# Patient Record
Sex: Male | Born: 1975 | Race: Black or African American | Hispanic: No | Marital: Single | Smoking: Never smoker
Health system: Southern US, Community
[De-identification: ages and names within clinical notes are randomized; demographics above are authoritative.]

## PROBLEM LIST (undated history)

## (undated) DIAGNOSIS — Z650 Conviction in civil and criminal proceedings without imprisonment: Secondary | ICD-10-CM

## (undated) DIAGNOSIS — F29 Unspecified psychosis not due to a substance or known physiological condition: Secondary | ICD-10-CM

---

## 2009-06-21 ENCOUNTER — Emergency Department (HOSPITAL_COMMUNITY): Admission: EM | Admit: 2009-06-21 | Discharge: 2009-06-21 | Payer: Self-pay | Admitting: Emergency Medicine

## 2015-04-18 ENCOUNTER — Inpatient Hospital Stay (HOSPITAL_COMMUNITY)
Admission: EM | Admit: 2015-04-18 | Discharge: 2015-04-19 | DRG: 885 | Disposition: A | Discharge status: Home / Self Care | Payer: Self-pay | Attending: Internal Medicine | Admitting: Internal Medicine

## 2015-04-18 ENCOUNTER — Encounter (HOSPITAL_COMMUNITY): Payer: Self-pay | Admitting: Internal Medicine

## 2015-04-18 ENCOUNTER — Inpatient Hospital Stay (HOSPITAL_COMMUNITY): Payer: MEDICAID

## 2015-04-18 DIAGNOSIS — Z79899 Other long term (current) drug therapy: Secondary | ICD-10-CM

## 2015-04-18 DIAGNOSIS — N289 Disorder of kidney and ureter, unspecified: Secondary | ICD-10-CM

## 2015-04-18 DIAGNOSIS — I129 Hypertensive chronic kidney disease with stage 1 through stage 4 chronic kidney disease, or unspecified chronic kidney disease: Secondary | ICD-10-CM | POA: Diagnosis present

## 2015-04-18 DIAGNOSIS — F602 Antisocial personality disorder: Secondary | ICD-10-CM | POA: Diagnosis present

## 2015-04-18 DIAGNOSIS — K7689 Other specified diseases of liver: Secondary | ICD-10-CM

## 2015-04-18 DIAGNOSIS — E860 Dehydration: Secondary | ICD-10-CM | POA: Diagnosis present

## 2015-04-18 DIAGNOSIS — Z72811 Adult antisocial behavior: Secondary | ICD-10-CM | POA: Diagnosis present

## 2015-04-18 DIAGNOSIS — R945 Abnormal results of liver function studies: Secondary | ICD-10-CM | POA: Diagnosis present

## 2015-04-18 DIAGNOSIS — N184 Chronic kidney disease, stage 4 (severe): Secondary | ICD-10-CM | POA: Diagnosis present

## 2015-04-18 DIAGNOSIS — F1290 Cannabis use, unspecified, uncomplicated: Secondary | ICD-10-CM | POA: Diagnosis present

## 2015-04-18 DIAGNOSIS — R9431 Abnormal electrocardiogram [ECG] [EKG]: Secondary | ICD-10-CM | POA: Diagnosis present

## 2015-04-18 DIAGNOSIS — F23 Brief psychotic disorder: Secondary | ICD-10-CM | POA: Insufficient documentation

## 2015-04-18 DIAGNOSIS — E876 Hypokalemia: Secondary | ICD-10-CM | POA: Diagnosis present

## 2015-04-18 DIAGNOSIS — N179 Acute kidney failure, unspecified: Secondary | ICD-10-CM | POA: Diagnosis present

## 2015-04-18 DIAGNOSIS — F29 Unspecified psychosis not due to a substance or known physiological condition: Principal | ICD-10-CM | POA: Diagnosis present

## 2015-04-18 HISTORY — DX: Conviction in civil and criminal proceedings without imprisonment: Z65.0

## 2015-04-18 HISTORY — DX: Unspecified psychosis not due to a substance or known physiological condition: F29

## 2015-04-18 LAB — CBC WITH DIFFERENTIAL/PLATELET
BASOS PCT: 1 % (ref 0–1)
Basophils Absolute: 0 10*3/uL (ref 0.0–0.1)
Eosinophils Absolute: 0 10*3/uL (ref 0.0–0.7)
Eosinophils Relative: 0 % (ref 0–5)
HEMATOCRIT: 38 % — AB (ref 39.0–52.0)
HEMOGLOBIN: 13.3 g/dL (ref 13.0–17.0)
Lymphocytes Relative: 28 % (ref 12–46)
Lymphs Abs: 2.4 10*3/uL (ref 0.7–4.0)
MCH: 28.7 pg (ref 26.0–34.0)
MCHC: 35 g/dL (ref 30.0–36.0)
MCV: 81.9 fL (ref 78.0–100.0)
MONOS PCT: 11 % (ref 3–12)
Monocytes Absolute: 1 10*3/uL (ref 0.1–1.0)
NEUTROS PCT: 60 % (ref 43–77)
Neutro Abs: 5.2 10*3/uL (ref 1.7–7.7)
Platelets: 313 10*3/uL (ref 150–400)
RBC: 4.64 MIL/uL (ref 4.22–5.81)
RDW: 12.6 % (ref 11.5–15.5)
WBC: 8.6 10*3/uL (ref 4.0–10.5)

## 2015-04-18 LAB — COMPREHENSIVE METABOLIC PANEL
ALK PHOS: 49 U/L (ref 38–126)
ALT: 73 U/L — ABNORMAL HIGH (ref 17–63)
AST: 174 U/L — ABNORMAL HIGH (ref 15–41)
Albumin: 4.2 g/dL (ref 3.5–5.0)
Anion gap: 15 (ref 5–15)
BUN: 18 mg/dL (ref 6–20)
CALCIUM: 9.1 mg/dL (ref 8.9–10.3)
CO2: 23 mmol/L (ref 22–32)
Chloride: 98 mmol/L — ABNORMAL LOW (ref 101–111)
Creatinine, Ser: 1.68 mg/dL — ABNORMAL HIGH (ref 0.61–1.24)
GFR calc Af Amer: 59 mL/min — ABNORMAL LOW (ref >60)
GFR calc non Af Amer: 51 mL/min — ABNORMAL LOW (ref >60)
GLUCOSE: 108 mg/dL — AB (ref 65–99)
POTASSIUM: 2.7 mmol/L — AB (ref 3.5–5.1)
Sodium: 136 mmol/L (ref 135–145)
TOTAL PROTEIN: 7.5 g/dL (ref 6.5–8.1)
Total Bilirubin: 0.9 mg/dL (ref 0.3–1.2)

## 2015-04-18 LAB — SALICYLATE LEVEL: Salicylate Lvl: <4 mg/dL (ref 2.8–30.0)

## 2015-04-18 LAB — RAPID URINE DRUG SCREEN, HOSP PERFORMED
Amphetamines: NOT DETECTED
BENZODIAZEPINES: NOT DETECTED
Barbiturates: NOT DETECTED
Cocaine: NOT DETECTED
OPIATES: NOT DETECTED
Tetrahydrocannabinol: POSITIVE — AB

## 2015-04-18 LAB — I-STAT TROPONIN, ED: Troponin i, poc: 0.03 ng/mL (ref 0.00–0.08)

## 2015-04-18 LAB — ACETAMINOPHEN LEVEL: Acetaminophen (Tylenol), Serum: <10 ug/mL — ABNORMAL LOW (ref 10–30)

## 2015-04-18 LAB — MAGNESIUM: Magnesium: 2.4 mg/dL (ref 1.7–2.4)

## 2015-04-18 LAB — ETHANOL: Alcohol, Ethyl (B): <5 mg/dL (ref <5)

## 2015-04-18 LAB — TROPONIN I: TROPONIN I: 0.03 ng/mL (ref <0.031)

## 2015-04-18 LAB — TSH: TSH: 0.885 u[IU]/mL (ref 0.350–4.500)

## 2015-04-18 MED ORDER — LORAZEPAM 2 MG/ML IJ SOLN
2.0000 mg | Freq: Once | INTRAMUSCULAR | Status: AC
  Administered 2015-04-18: 2 mg via INTRAVENOUS
  Filled 2015-04-18: qty 1

## 2015-04-18 MED ORDER — ASPIRIN EC 325 MG PO TBEC
325.0000 mg | DELAYED_RELEASE_TABLET | Freq: Every day | ORAL | Status: DC
  Administered 2015-04-19: 325 mg via ORAL
  Filled 2015-04-18 (×2): qty 1

## 2015-04-18 MED ORDER — NITROGLYCERIN 0.4 MG SL SUBL: 0.4000 mg | SUBLINGUAL_TABLET | SUBLINGUAL | Status: DC | PRN

## 2015-04-18 MED ORDER — LORAZEPAM 2 MG/ML IJ SOLN
1.0000 mg | INTRAMUSCULAR | Status: DC | PRN
  Administered 2015-04-19: 1 mg via INTRAVENOUS
  Filled 2015-04-18: qty 1

## 2015-04-18 MED ORDER — POTASSIUM CHLORIDE 10 MEQ/100ML IV SOLN
10.0000 meq | Freq: Once | INTRAVENOUS | Status: AC
  Administered 2015-04-18: 10 meq via INTRAVENOUS
  Filled 2015-04-18: qty 100

## 2015-04-18 MED ORDER — SODIUM CHLORIDE 0.9 % IJ SOLN
3.0000 mL | Freq: Two times a day (BID) | INTRAMUSCULAR | Status: DC
  Administered 2015-04-18: 3 mL via INTRAVENOUS

## 2015-04-18 MED ORDER — SODIUM CHLORIDE 0.9 % IV BOLUS (SEPSIS): 1000.0000 mL | Freq: Once | INTRAVENOUS | Status: DC

## 2015-04-18 MED ORDER — SODIUM CHLORIDE 0.9 % IV SOLN
INTRAVENOUS | Status: DC
  Administered 2015-04-18 – 2015-04-19 (×2): via INTRAVENOUS

## 2015-04-18 MED ORDER — ALUM & MAG HYDROXIDE-SIMETH 200-200-20 MG/5ML PO SUSP: 30.0000 mL | Freq: Four times a day (QID) | ORAL | Status: DC | PRN

## 2015-04-18 MED ORDER — POTASSIUM CHLORIDE 20 MEQ/15ML (10%) PO SOLN: 40.0000 meq | Freq: Once | ORAL | Status: DC

## 2015-04-18 MED ORDER — ATORVASTATIN CALCIUM 40 MG PO TABS
40.0000 mg | ORAL_TABLET | Freq: Every day | ORAL | Status: DC
  Administered 2015-04-19: 40 mg via ORAL
  Filled 2015-04-18 (×2): qty 1

## 2015-04-18 MED ORDER — GUAIFENESIN-DM 100-10 MG/5ML PO SYRP: 5.0000 mL | ORAL_SOLUTION | ORAL | Status: DC | PRN

## 2015-04-18 MED ORDER — ACETAMINOPHEN 325 MG PO TABS: 650.0000 mg | ORAL_TABLET | Freq: Four times a day (QID) | ORAL | Status: DC | PRN

## 2015-04-18 MED ORDER — STERILE WATER FOR INJECTION IJ SOLN
INTRAMUSCULAR | Status: AC
  Administered 2015-04-18: 16:00:00
  Filled 2015-04-18: qty 10

## 2015-04-18 MED ORDER — HEPARIN SODIUM (PORCINE) 5000 UNIT/ML IJ SOLN
5000.0000 [IU] | Freq: Three times a day (TID) | INTRAMUSCULAR | Status: DC
  Administered 2015-04-19 (×2): 5000 [IU] via SUBCUTANEOUS
  Filled 2015-04-18 (×4): qty 1

## 2015-04-18 MED ORDER — ZIPRASIDONE MESYLATE 20 MG IM SOLR
20.0000 mg | Freq: Once | INTRAMUSCULAR | Status: AC
  Administered 2015-04-18: 20 mg via INTRAMUSCULAR
  Filled 2015-04-18: qty 20

## 2015-04-18 MED ORDER — POTASSIUM CHLORIDE 10 MEQ/100ML IV SOLN
10.0000 meq | INTRAVENOUS | Status: AC
  Administered 2015-04-18: 10 meq via INTRAVENOUS
  Filled 2015-04-18: qty 100

## 2015-04-18 MED ORDER — ACETAMINOPHEN 650 MG RE SUPP: 650.0000 mg | Freq: Four times a day (QID) | RECTAL | Status: DC | PRN

## 2015-04-18 NOTE — H&P (Signed)
[facility] History and Physical  Walter Cain DOB: 10-Dec-1975 DOA: 04/18/2015  Referring physician: ED physician PCP: No primary care provider on file.  Specialists:   Chief Complaint: acute phychosis  HPI: Walter Cain is a 39 y.o. male with PMH of psychosis, criminal record (3 murders per policeman), currently under IVC, who presents with acute phychosis.   Patient is sedated with Geodon initially, and is still partially sedated when I saw patient. It is very difficult to get accurate history. When I tried to wake him and talk to him, he keeps saying "No, ask me quickly", does not answer questions straightly, but did say that he feels cold, has mild cough, and told me some of his family medical history. He denies chest pain, abdominal pain, SOB, diarrhea, weakness and leg edema. Per ED, he patient is well known to [employer]. Per [employer] he destroyed most of the furniture in his house, the wife called [employer] who says they found her in the basement naked when they arrived. He is restrained with cuffs. He speaks about Glenetta Borg and basketball. He has hallucination. At arrival to ED, patient is psychotic and aggressive. He is sedated with Geodon.  In ED, patient was found to have AKI, negative troponin, abnormal liver functions with AST 174, ALT 73, tachycardia, normal temperature, alcohol level less than 5, WBC 8.6. Potassium 2.7, magnesium 2.4, UDS positive for THC. EKG showed T-wave inversion in inferior leads and V3-V6, QTC prolongation 578, mild ST elevation in V1 to V2. Because of AKi, abnormal EKG, severe hypokalemia, we are asked to admit pt to medical service today. Patient is admitted to inpatient for further evaluation treatment. Psychiatry was consulted by ED, will see him in morning. Card was consulted by ED.   Where does patient live? IVC Can patient participate in ADLs?  Barely  Review of Systems: could not obtained reliably due to pychosis  Allergy: No Known  Allergies  Past Medical History  Diagnosis Date  . Psychosis   . Convicted for criminal activity     murder    No past surgical history on file.  Social History:  has no tobacco, alcohol, and drug history on file. Patient says "no" to all related questions.  Family History:  Family History  Problem Relation Age of Onset  . Hypertension Mother   . Hypertension Father   . Renal Disease Mother   . Heart disease Father      Prior to Admission medications   Not on File    Physical Exam: Filed Vitals:   04/18/15 1603 04/18/15 1810  BP: 135/96 126/56  Pulse: 102 62  Temp: 98.7 F (37.1 C)   TempSrc: Oral   Resp: 20 18  SpO2: 98% 100%   General: very agitated, psychotic. HEENT:       Eyes: PERRL, EOMI, no scleral icterus.       ENT: No discharge from the ears and nose, no pharynx injection, no tonsillar enlargement.        Neck: No JVD, no bruit, no mass felt. Heme: No neck lymph node enlargement. Cardiac: S1/S2, RRR, No murmurs, No gallops or rubs. Pulm: No rales, wheezing, rhonchi or rubs. Abd: Soft, nondistended, nontender, no rebound pain, no organomegaly, BS present. Ext: No pitting leg edema bilaterally. 2+DP/PT pulse bilaterally. Musculoskeletal: No joint deformities, No joint redness or warmth, no limitation of ROM in spin. Skin: No rashes.  Neuro: Alert, oriented X3, cranial nerves II-XII grossly intact, muscle strength 5/5 in all  extremities, sensation to light touch intact.  Psych: psychotic, no suicidal or hemocidal ideation.  Labs on Admission:  Basic Metabolic Panel:  Recent Labs Lab 04/18/15 1728 04/18/15 1901  NA 136  --   K 2.7*  --   CL 98*  --   CO2 23  --   GLUCOSE 108*  --   BUN 18  --   CREATININE 1.68*  --   CALCIUM 9.1  --   MG  --  2.4   Liver Function Tests:  Recent Labs Lab 04/18/15 1728  AST 174*  ALT 73*  ALKPHOS 49  BILITOT 0.9  PROT 7.5  ALBUMIN 4.2   No results for input(s): LIPASE, AMYLASE in the last 168  hours. No results for input(s): AMMONIA in the last 168 hours. CBC:  Recent Labs Lab 04/18/15 1728  WBC 8.6  NEUTROABS 5.2  HGB 13.3  HCT 38.0*  MCV 81.9  PLT 313   Cardiac Enzymes: No results for input(s): CKTOTAL, CKMB, CKMBINDEX, TROPONINI in the last 168 hours.  BNP (last 3 results) No results for input(s): BNP in the last 8760 hours.  ProBNP (last 3 results) No results for input(s): PROBNP in the last 8760 hours.  CBG: No results for input(s): GLUCAP in the last 168 hours.  Radiological Exams on Admission: No results found.  EKG: Independently reviewed.  Abnormal findings: T-wave inversion in inferior leads and V3-V6, QTC prolongation 578, mild ST elevation in V1 to V2.   Assessment/Plan Principal Problem:   AKI (acute kidney injury) Active Problems:   Psychosis   Abnormal EKG   Hypokalemia   EKG abnormalities   Abnormal liver function  AKI: Cre 1.68 and BUN 18. likely due to prerenal secondary to dehydration, but the ration of Cre/BUN is not completely consistent with prerenal failure.  -admit to tele bed -IVF: 1L NS bolus and then 125 cc/h -Check FeNa -US-renal -Avoid  NSAIDs -Follow up renal function by BMP  Abnormal EKG: T-wave inversion in inferior leads and V3-V6, QTC prolongation 578, mild ST elevation in V1 to V2. Currently no chest pain. Cardiology was consulted. Dr. Delton See reviewed EKG, recommend to replace potassium and repeat ECG. Will see the patient in the morning. - cycle CE q6 x3 and repeat her EKG in the am  - prn Nitroglycerin, and aspirin, lipitor  - Risk factor stratification: will check FLP and A1C  - 2d echo - follow up card Recs  Hypokalemia: K= 2,7 and Mg=2.5 on admission. - Repleted with total of 70 mEq of KCl  Abnormal liver function: AST 174, ALT 73, ALT 49, total bilirubin 0.9, ratio of AST/ALT is consistent with a possible alcohol abuse, but need to r/o other possibility, such as hepatitis.  -check hepatitis panel. -HIV  Ab  Acute Psychosis:  -consulted psych by ED. -prn Ativan -partially sedated with Geodon -sitter bed side   DVT ppx: SQ Heparin   Code Status: Full code Family Communication:   Yes, police  at bed side Disposition Plan: Admit to inpatient   Date of Service 04/18/2015    Lorretta Harp [facility] Pager [phone]  If 7PM-7AM, please contact night-coverage [email or web address] Password [email or web address] 04/18/2015, 8:22 PM

## 2015-04-18 NOTE — ED Provider Notes (Signed)
CSN: [id number]     Arrival date & time 04/18/15  1555 History   First MD Initiated Contact with Patient 04/18/15 1609     Chief Complaint  Patient presents with  . Aggressive Behavior     (Consider location/radiation/quality/duration/timing/severity/associated sxs/prior Treatment) HPI  LEVEL V Caveat- psychosis  PCP: No primary care provider on file. Blood pressure 135/96, pulse 102, temperature 98.7 F (37.1 C), temperature source Oral, resp. rate 20, SpO2 98 %.  Walter Cain is a 39 y.o.male with no significant PMH of  presents to the ER by [employer] as an IVC. The patient is well known to [employer], per [employer], but has no record of visits to this hospital per chart review.  Patient is currently psychotic and aggressive. Per [employer] he destroyed most of the furniture in his house, the wife called [employer] who says they found her in the basement naked when they arrived. He is currently restrained with cuffs. He speaks about Glenetta Borg and basketball. He repeats things that he hears said, he is very angry and will not answer any of my question. Pts only respond to myself is that i'm stupid and that wants to know if I can spell "Kool-Aid" - becomes angry kicking and thrashing when I won't answer.   Past Medical History  Diagnosis Date  . Psychosis   . Convicted for criminal activity     murder   No past surgical history on file. No family history on file. History  Substance Use Topics  . Smoking status: Not on file  . Smokeless tobacco: Not on file  . Alcohol Use: Not on file    Review of Systems  LEVEL V Caveat- psychosis  Allergies  Review of patient's allergies indicates not on file.  Home Medications   Prior to Admission medications   Not on File   BP 126/56 mmHg  Pulse 62  Temp(Src) 98.7 F (37.1 C) (Oral)  Resp 18  SpO2 100% Physical Exam  Constitutional: He appears well-developed and well-nourished. He appears distressed (acute psychosis).  HENT:  Head: Normocephalic and  atraumatic.  Eyes: Pupils are equal, round, and reactive to light.  Neck: Normal range of motion. Neck supple.  Cardiovascular: Normal rate.   Pulmonary/Chest: Effort normal.  Neurological: He is alert.  Skin: Skin is warm and dry.  Psychiatric: His affect is inappropriate. His speech is rapid and/or pressured. He is agitated, aggressive, hyperactive, actively hallucinating and combative.  Nursing note and vitals reviewed.   ED Course  Procedures (including critical care time) Labs Review Labs Reviewed  CBC WITH DIFFERENTIAL/PLATELET - Abnormal; Notable for the following:    HCT 38.0 (*)    All other components within normal limits  COMPREHENSIVE METABOLIC PANEL - Abnormal; Notable for the following:    Potassium 2.7 (*)    Chloride 98 (*)    Glucose, Bld 108 (*)    Creatinine, Ser 1.68 (*)    AST 174 (*)    ALT 73 (*)    GFR calc non Af Amer 51 (*)    GFR calc Af Amer 59 (*)    All other components within normal limits  ACETAMINOPHEN LEVEL - Abnormal; Notable for the following:    Acetaminophen (Tylenol), Serum <10 (*)    All other components within normal limits  ETHANOL  SALICYLATE LEVEL  URINE RAPID DRUG SCREEN (HOSP PERFORMED) NOT AT [facility]  MAGNESIUM  I-STAT TROPOININ, ED    Imaging Review No results found.   EKG Interpretation  Date/Time:  Thursday April 18 2015 18:24:06 EDT Ventricular Rate:  66 PR Interval:  162 QRS Duration: 110 QT Interval:  552 QTC Calculation: 578 R Axis:   59 Text Interpretation:  Sinus rhythm Probable LVH with secondary repol abnrm  Abnormal T, consider ischemia, inferior leads Minimal ST elevation,  lateral leads Prolonged QT interval unchanged from the ekg done few  minutes ago today questionable u waves Abnormal ECG diffuse ST depression,  inferior and lateral leads Confirmed by Rhunette Croft, MD, Janey Genta [phone]) on  04/18/2015 6:55:51 PM      MDM   Final diagnoses:  EKG abnormalities  Acute psychosis  Hypokalemia  Renal  insufficiency    Medications  potassium chloride 10 mEq in 100 mL IVPB (10 mEq Intravenous New Bag/Given 04/18/15 1854)  ziprasidone (GEODON) injection 20 mg (20 mg Intramuscular Given 04/18/15 1626)  sterile water (preservative free) injection (  Given 04/18/15 1626)   IVC papers en route per [employer], confirmed signed by Magistrate, pt uncooperative and aggressive.   Patient has hypokalemia, elevated creatinine and abnormal EKG. pt denies CP, neg Trop.  Discussed case with Dr. Rhunette Croft who has seen patient as well.  Recommends consulting cardiology and admission with sitter. Discussed case with Cardiology who recommends:  41 -year old male admitted after acute psychotic episode, accompanied by police. ECG reviewed upon request from ER PA. The patient admitted with acute psychosis and needs medical clearance. Unknown drug history. Significant hypokalemia on admission to the ER at 2.7 with elevated creatinine, no prior labs or ECG. ECG shows SR, LVH with repolarization abnormalities and prolonged QT/QTc interval. The patient is sedated but doesn't complain of chest pain. Toxicology is pending. I would recommend to replace potassium, repeat ECG. We will see the patient in the morning. He will need an official consult prior to admition to an inpatient psychiatry.  Lars Masson, MD 04/18/2015   [facility] admitted to [facility] for potassium replacement and dehydration and Cardiology will official consult in the morning. UDS pending, chest xray not completed. Magnesium pending.  Filed Vitals:   04/18/15 1810  BP: 126/56  Pulse: 62  Temp:   Resp: [address] PA-C 04/18/15 1935  Derwood Kaplan, MD 04/18/15 2237

## 2015-04-18 NOTE — ED Notes (Signed)
Staffing office made aware of need for sitter 

## 2015-04-18 NOTE — Progress Notes (Signed)
Report given to [facility] at this time.

## 2015-04-18 NOTE — ED Notes (Signed)
Pt brought in by [employer], pt being IVC'd by officer and family member. Pt having aggressive and manic behavior. Pt speaking loudly in triage.

## 2015-04-18 NOTE — Progress Notes (Addendum)
39 -year old male admitted after acute psychotic episode, accompanied by police. ECG reviewed upon request from ER PA. The patient admitted with acute psychosis and needs medical clearance. Unknown drug history. Significant hypokalemia on admission to the ER at 2.7 with elevated creatinine, no prior labs or ECG. ECG shows SR, LVH with repolarization abnormalities and prolonged QT/QTc interval. The patient is sedated but doesn't complain of chest pain. Toxicology is pending. I would recommend to replace potassium, repeat ECG. We will see the patient in the morning. He will need an official consult prior to admition to an inpatient psychiatry.  Lars Masson, MD 04/18/2015

## 2015-04-19 ENCOUNTER — Inpatient Hospital Stay (HOSPITAL_COMMUNITY): Payer: Self-pay

## 2015-04-19 ENCOUNTER — Inpatient Hospital Stay (HOSPITAL_COMMUNITY): Payer: MEDICAID

## 2015-04-19 DIAGNOSIS — R451 Restlessness and agitation: Secondary | ICD-10-CM

## 2015-04-19 DIAGNOSIS — Z72811 Adult antisocial behavior: Secondary | ICD-10-CM | POA: Diagnosis present

## 2015-04-19 DIAGNOSIS — I517 Cardiomegaly: Secondary | ICD-10-CM

## 2015-04-19 DIAGNOSIS — R9431 Abnormal electrocardiogram [ECG] [EKG]: Secondary | ICD-10-CM

## 2015-04-19 DIAGNOSIS — F29 Unspecified psychosis not due to a substance or known physiological condition: Principal | ICD-10-CM

## 2015-04-19 DIAGNOSIS — F1299 Cannabis use, unspecified with unspecified cannabis-induced disorder: Secondary | ICD-10-CM

## 2015-04-19 DIAGNOSIS — E876 Hypokalemia: Secondary | ICD-10-CM

## 2015-04-19 LAB — LIPID PANEL
CHOLESTEROL: 126 mg/dL (ref 0–200)
HDL: 46 mg/dL (ref >40)
LDL Cholesterol: 70 mg/dL (ref 0–99)
TRIGLYCERIDES: 51 mg/dL (ref <150)
Total CHOL/HDL Ratio: 2.7 RATIO
VLDL: 10 mg/dL (ref 0–40)

## 2015-04-19 LAB — CREATININE, URINE, RANDOM: Creatinine, Urine: 57.81 mg/dL

## 2015-04-19 LAB — COMPREHENSIVE METABOLIC PANEL
ALT: 65 U/L — ABNORMAL HIGH (ref 17–63)
AST: 138 U/L — ABNORMAL HIGH (ref 15–41)
Albumin: 3.9 g/dL (ref 3.5–5.0)
Alkaline Phosphatase: 46 U/L (ref 38–126)
Anion gap: 9 (ref 5–15)
BILIRUBIN TOTAL: 0.5 mg/dL (ref 0.3–1.2)
BUN: 22 mg/dL — AB (ref 6–20)
CO2: 27 mmol/L (ref 22–32)
Calcium: 8.6 mg/dL — ABNORMAL LOW (ref 8.9–10.3)
Chloride: 101 mmol/L (ref 101–111)
Creatinine, Ser: 1.58 mg/dL — ABNORMAL HIGH (ref 0.61–1.24)
GFR, EST NON AFRICAN AMERICAN: 54 mL/min — AB (ref >60)
GLUCOSE: 101 mg/dL — AB (ref 65–99)
Potassium: 3.3 mmol/L — ABNORMAL LOW (ref 3.5–5.1)
Sodium: 137 mmol/L (ref 135–145)
Total Protein: 7.1 g/dL (ref 6.5–8.1)

## 2015-04-19 LAB — URINALYSIS, ROUTINE W REFLEX MICROSCOPIC
BILIRUBIN URINE: NEGATIVE
Glucose, UA: NEGATIVE mg/dL
HGB URINE DIPSTICK: NEGATIVE
Ketones, ur: NEGATIVE mg/dL
LEUKOCYTES UA: NEGATIVE
Nitrite: NEGATIVE
Protein, ur: NEGATIVE mg/dL
Specific Gravity, Urine: 1.005 (ref 1.005–1.030)
Urobilinogen, UA: 0.2 mg/dL (ref 0.0–1.0)
pH: 6 (ref 5.0–8.0)

## 2015-04-19 LAB — CBC
HEMATOCRIT: 38.4 % — AB (ref 39.0–52.0)
HEMOGLOBIN: 12.9 g/dL — AB (ref 13.0–17.0)
MCH: 28.4 pg (ref 26.0–34.0)
MCHC: 33.6 g/dL (ref 30.0–36.0)
MCV: 84.6 fL (ref 78.0–100.0)
PLATELETS: 315 10*3/uL (ref 150–400)
RBC: 4.54 MIL/uL (ref 4.22–5.81)
RDW: 12.9 % (ref 11.5–15.5)
WBC: 6.6 10*3/uL (ref 4.0–10.5)

## 2015-04-19 LAB — HIV ANTIBODY (ROUTINE TESTING W REFLEX): HIV SCREEN 4TH GENERATION: NONREACTIVE

## 2015-04-19 LAB — TROPONIN I: Troponin I: <0.03 ng/mL (ref <0.031)

## 2015-04-19 LAB — SODIUM, URINE, RANDOM

## 2015-04-19 LAB — GLUCOSE, CAPILLARY: Glucose-Capillary: 101 mg/dL — ABNORMAL HIGH (ref 65–99)

## 2015-04-19 MED ORDER — AMLODIPINE BESYLATE 10 MG PO TABS: 10.0000 mg | ORAL_TABLET | Freq: Every day | ORAL | Status: AC

## 2015-04-19 MED ORDER — AMLODIPINE BESYLATE 10 MG PO TABS
10.0000 mg | ORAL_TABLET | Freq: Every day | ORAL | Status: DC
  Administered 2015-04-19: 10 mg via ORAL
  Filled 2015-04-19: qty 1

## 2015-04-19 MED ORDER — CARVEDILOL 3.125 MG PO TABS: 3.1250 mg | ORAL_TABLET | Freq: Two times a day (BID) | ORAL | Status: AC

## 2015-04-19 MED ORDER — POTASSIUM CHLORIDE IN NACL 40-0.9 MEQ/L-% IV SOLN
INTRAVENOUS | Status: DC
  Administered 2015-04-19: 100 mL/h via INTRAVENOUS
  Filled 2015-04-19 (×2): qty 1000

## 2015-04-19 MED ORDER — ASPIRIN EC 81 MG PO TBEC: 81.0000 mg | DELAYED_RELEASE_TABLET | Freq: Every day | ORAL | Status: AC

## 2015-04-19 MED ORDER — POTASSIUM CHLORIDE CRYS ER 20 MEQ PO TBCR
40.0000 meq | EXTENDED_RELEASE_TABLET | Freq: Once | ORAL | Status: AC
  Administered 2015-04-19: 40 meq via ORAL
  Filled 2015-04-19: qty 2

## 2015-04-19 NOTE — Progress Notes (Signed)
Patient given discharge instructions, and verbalized an understanding of all discharge instructions.  Patient agrees with discharge plan, and is being discharged in stable medical condition.  Patient given bus pass for transportation.  Patient assisted to bus stop by Patent examiner.  Philomena Doheny RN

## 2015-04-19 NOTE — Progress Notes (Signed)
Psych doctor rescinded IVC paperwork, and recomended outpatient follow up at [facility].  Social work at ED at [facility] consulted to take care of paperwork and outpatient follow up.  Social Work Dahlia Client suggested that rescinded copy of IVC paperwork be taken down to Psych ED to be placed in folder.  Also, outpatient follow up information for [facility] added to AVS, and patient will have to walk in.  Secretary attempted to set up follow up with kidney specialist, but they require that the patient set that up as a new patient.
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
## 2015-04-19 NOTE — Discharge Instructions (Signed)
Follow with Primary MD  in 7 days   Get CBC, CMP, 2 view Chest X ray checked  by Primary MD next visit. Follow on final Hepatitis and HIV results which are pending.   Activity: As tolerated with Full fall precautions use walker/cane & assistance as needed   Disposition Home    Diet: Heart Healthy   For Heart failure patients - Check your Weight same time everyday, if you gain over 2 pounds, or you develop in leg swelling, experience more shortness of breath or chest pain, call your Primary MD immediately. Follow Cardiac Low Salt Diet and 1.5 lit/day fluid restriction.   On your next visit with your primary care physician please Get Medicines reviewed and adjusted.   Please request your Prim.MD to go over all Hospital Tests and Procedure/Radiological results at the follow up, please get all Hospital records sent to your Prim MD by signing hospital release before you go home.   If you experience worsening of your admission symptoms, develop shortness of breath, life threatening emergency, suicidal or homicidal thoughts you must seek medical attention immediately by calling 911 or calling your MD immediately  if symptoms less severe.  You Must read complete instructions/literature along with all the possible adverse reactions/side effects for all the Medicines you take and that have been prescribed to you. Take any new Medicines after you have completely understood and accpet all the possible adverse reactions/side effects.   Do not drive, operating heavy machinery, perform activities at heights, swimming or participation in water activities or provide baby sitting services if your were admitted for syncope or siezures until you have seen by Primary MD or a Neurologist and advised to do so again.  Do not drive when taking Pain medications.    Do not take more than prescribed Pain, Sleep and Anxiety Medications  Special Instructions: If you have smoked or chewed Tobacco  in the last 2  yrs please stop smoking, stop any regular Alcohol  and or any Recreational drug use.  Wear Seat belts while driving.   Please note  You were cared for by a hospitalist during your hospital stay. If you have any questions about your discharge medications or the care you received while you were in the hospital after you are discharged, you can call the unit and asked to speak with the hospitalist on call if the hospitalist that took care of you is not available. Once you are discharged, your primary care physician will handle any further medical issues. Please note that NO REFILLS for any discharge medications will be authorized once you are discharged, as it is imperative that you return to your primary care physician (or establish a relationship with a primary care physician if you do not have one) for your aftercare needs so that they can reassess your need for medications and monitor your lab values.

## 2015-04-19 NOTE — Progress Notes (Addendum)
CSW made aware of patient by Psych MD. Attempting to collect collateral information on patient's history for psychiatry- patient too agitated to assess at this time.  Per [employer], if patient is medically cleared and psychiatry clears, they will "escort him off the property".  CSW will update as information is obtained.    Reece Levy, MSW, Theresia Majors [phone]

## 2015-04-19 NOTE — Progress Notes (Addendum)
Patient to be held overnight to be observed by psychiatry, per social worker Brady.  SW notified this nurse that psychiatrist has chosen to rescind IVC paperwork.  Will await psych MD to sign paperwork, and then patient to be discharged.
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
## 2015-04-20 LAB — URINE CULTURE
Colony Count: NO GROWTH
Culture: NO GROWTH

## 2015-04-20 LAB — HEPATITIS PANEL, ACUTE
HCV Ab: <0.1 s/co ratio (ref 0.0–0.9)
HEP A IGM: NEGATIVE
Hep B C IgM: NEGATIVE
Hepatitis B Surface Ag: NEGATIVE

## 2015-04-20 LAB — HEMOGLOBIN A1C
Hgb A1c MFr Bld: 6.4 % — ABNORMAL HIGH (ref 4.8–5.6)
Mean Plasma Glucose: 137 mg/dL

## 2015-04-21 ENCOUNTER — Emergency Department (HOSPITAL_COMMUNITY): Payer: Self-pay

## 2015-04-21 ENCOUNTER — Emergency Department (HOSPITAL_COMMUNITY)
Admission: EM | Admit: 2015-04-21 | Discharge: 2015-04-21 | Disposition: A | Discharge status: Home / Self Care | Payer: Self-pay | Attending: Emergency Medicine | Admitting: Emergency Medicine

## 2015-04-21 ENCOUNTER — Encounter (HOSPITAL_COMMUNITY): Payer: Self-pay | Admitting: Emergency Medicine

## 2015-04-21 DIAGNOSIS — Z23 Encounter for immunization: Secondary | ICD-10-CM | POA: Insufficient documentation

## 2015-04-21 DIAGNOSIS — S9002XA Contusion of left ankle, initial encounter: Secondary | ICD-10-CM | POA: Insufficient documentation

## 2015-04-21 DIAGNOSIS — Z650 Conviction in civil and criminal proceedings without imprisonment: Secondary | ICD-10-CM | POA: Insufficient documentation

## 2015-04-21 DIAGNOSIS — F29 Unspecified psychosis not due to a substance or known physiological condition: Secondary | ICD-10-CM | POA: Insufficient documentation

## 2015-04-21 DIAGNOSIS — X58XXXA Exposure to other specified factors, initial encounter: Secondary | ICD-10-CM | POA: Insufficient documentation

## 2015-04-21 DIAGNOSIS — Z79899 Other long term (current) drug therapy: Secondary | ICD-10-CM | POA: Insufficient documentation

## 2015-04-21 DIAGNOSIS — S91312A Laceration without foreign body, left foot, initial encounter: Secondary | ICD-10-CM | POA: Insufficient documentation

## 2015-04-21 DIAGNOSIS — S21109A Unspecified open wound of unspecified front wall of thorax without penetration into thoracic cavity, initial encounter: Secondary | ICD-10-CM | POA: Insufficient documentation

## 2015-04-21 DIAGNOSIS — S8011XA Contusion of right lower leg, initial encounter: Secondary | ICD-10-CM | POA: Insufficient documentation

## 2015-04-21 DIAGNOSIS — Y9389 Activity, other specified: Secondary | ICD-10-CM | POA: Insufficient documentation

## 2015-04-21 DIAGNOSIS — Z7982 Long term (current) use of aspirin: Secondary | ICD-10-CM | POA: Insufficient documentation

## 2015-04-21 DIAGNOSIS — S3991XA Unspecified injury of abdomen, initial encounter: Secondary | ICD-10-CM | POA: Insufficient documentation

## 2015-04-21 DIAGNOSIS — Y9289 Other specified places as the place of occurrence of the external cause: Secondary | ICD-10-CM | POA: Insufficient documentation

## 2015-04-21 DIAGNOSIS — T07XXXA Unspecified multiple injuries, initial encounter: Secondary | ICD-10-CM

## 2015-04-21 DIAGNOSIS — S9032XA Contusion of left foot, initial encounter: Secondary | ICD-10-CM | POA: Insufficient documentation

## 2015-04-21 DIAGNOSIS — Y998 Other external cause status: Secondary | ICD-10-CM | POA: Insufficient documentation

## 2015-04-21 DIAGNOSIS — T148 Other injury of unspecified body region: Secondary | ICD-10-CM | POA: Insufficient documentation

## 2015-04-21 DIAGNOSIS — I10 Essential (primary) hypertension: Secondary | ICD-10-CM | POA: Insufficient documentation

## 2015-04-21 LAB — I-STAT CHEM 8, ED
BUN: 17 mg/dL (ref 6–20)
CHLORIDE: 102 mmol/L (ref 101–111)
Calcium, Ion: 1.12 mmol/L (ref 1.12–1.23)
Creatinine, Ser: 1.2 mg/dL (ref 0.61–1.24)
Glucose, Bld: 81 mg/dL (ref 65–99)
HCT: 38 % — ABNORMAL LOW (ref 39.0–52.0)
HEMOGLOBIN: 12.9 g/dL — AB (ref 13.0–17.0)
POTASSIUM: 3.3 mmol/L — AB (ref 3.5–5.1)
Sodium: 138 mmol/L (ref 135–145)
TCO2: 20 mmol/L (ref 0–100)

## 2015-04-21 MED ORDER — SODIUM CHLORIDE 0.9 % IV BOLUS (SEPSIS)
1000.0000 mL | Freq: Once | INTRAVENOUS | Status: AC
  Administered 2015-04-21: 1000 mL via INTRAVENOUS

## 2015-04-21 MED ORDER — CEPHALEXIN 500 MG PO CAPS
500.0000 mg | ORAL_CAPSULE | Freq: Once | ORAL | Status: AC
  Administered 2015-04-21: 500 mg via ORAL
  Filled 2015-04-21: qty 1

## 2015-04-21 MED ORDER — CEPHALEXIN 500 MG PO CAPS: 500.0000 mg | ORAL_CAPSULE | Freq: Four times a day (QID) | ORAL | Status: AC

## 2015-04-21 MED ORDER — IOHEXOL 300 MG/ML  SOLN
100.0000 mL | Freq: Once | INTRAMUSCULAR | Status: AC | PRN
Start: 2015-04-21 — End: 2015-04-21
  Administered 2015-04-21: 100 mL via INTRAVENOUS

## 2015-04-21 MED ORDER — BACITRACIN-NEOMYCIN-POLYMYXIN 400-5-5000 EX OINT
TOPICAL_OINTMENT | Freq: Once | CUTANEOUS | Status: AC
  Administered 2015-04-21: 16:00:00 via TOPICAL
  Filled 2015-04-21: qty 1

## 2015-04-21 MED ORDER — TETANUS-DIPHTH-ACELL PERTUSSIS 5-2.5-18.5 LF-MCG/0.5 IM SUSP
0.5000 mL | Freq: Once | INTRAMUSCULAR | Status: AC
  Administered 2015-04-21: 0.5 mL via INTRAMUSCULAR
  Filled 2015-04-21: qty 0.5

## 2015-04-21 MED ORDER — LIDOCAINE HCL (PF) 1 % IJ SOLN
5.0000 mL | Freq: Once | INTRAMUSCULAR | Status: AC
  Administered 2015-04-21: 5 mL
  Filled 2015-04-21: qty 5
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
## 2015-04-25 ENCOUNTER — Inpatient Hospital Stay: Payer: Self-pay | Admitting: Family Medicine

## 2016-12-06 IMAGING — DX DG FOOT COMPLETE 3+V*R*
3 series · 3 of 3 positions shown · non-contrast
Comparison: None.

CLINICAL DATA: Multiple lacerations of the feet and ankles
sustained last night. Initial encounter.

EXAM:
RIGHT FOOT COMPLETE - 3+ VIEW

[foot ap]
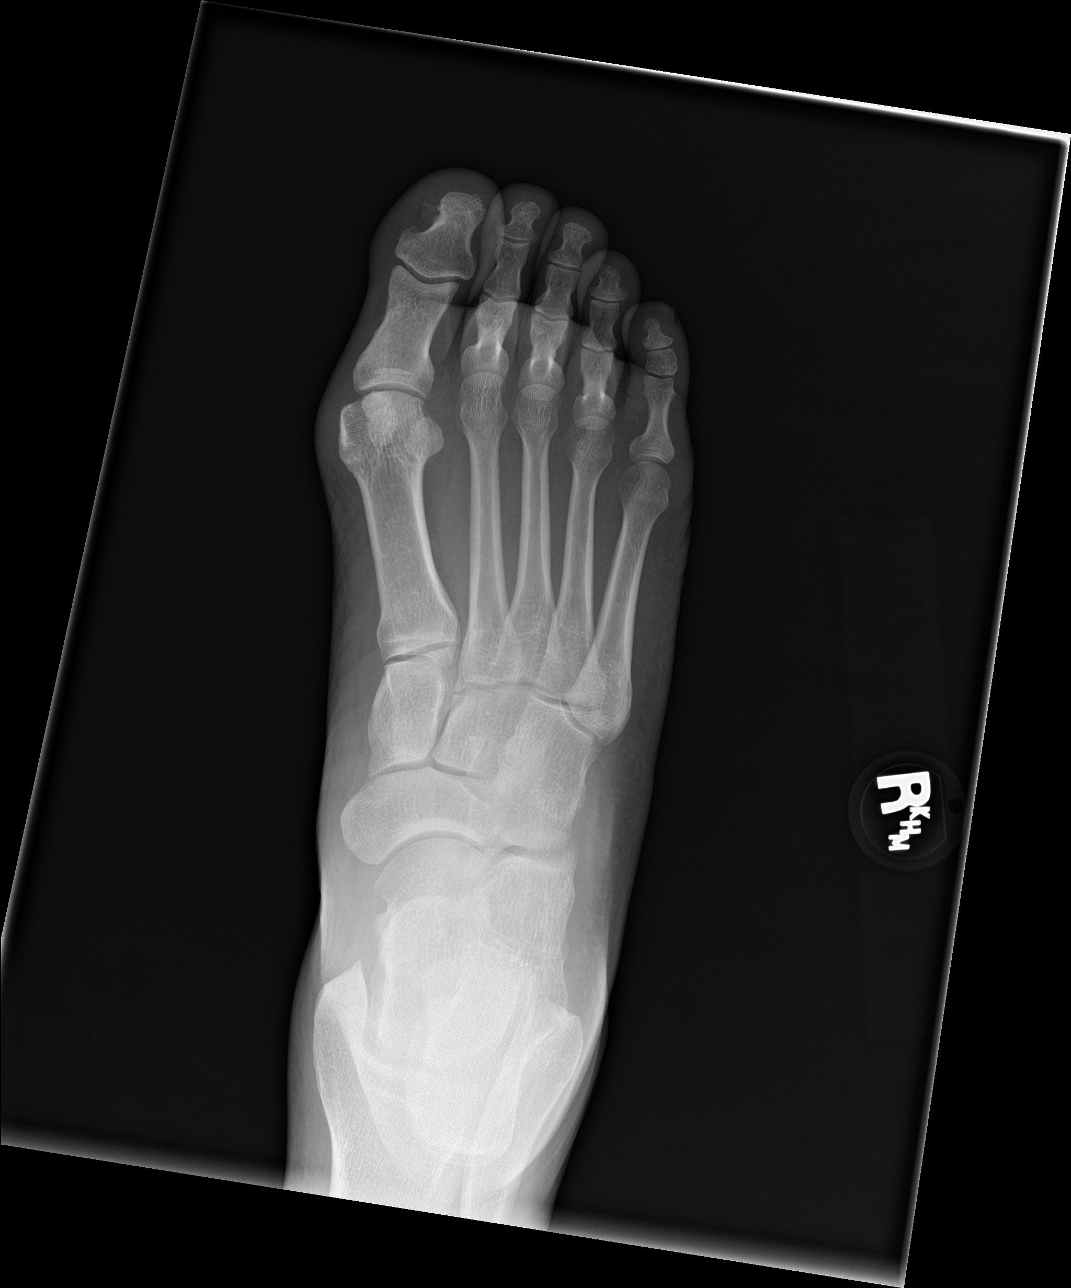

[foot obl]
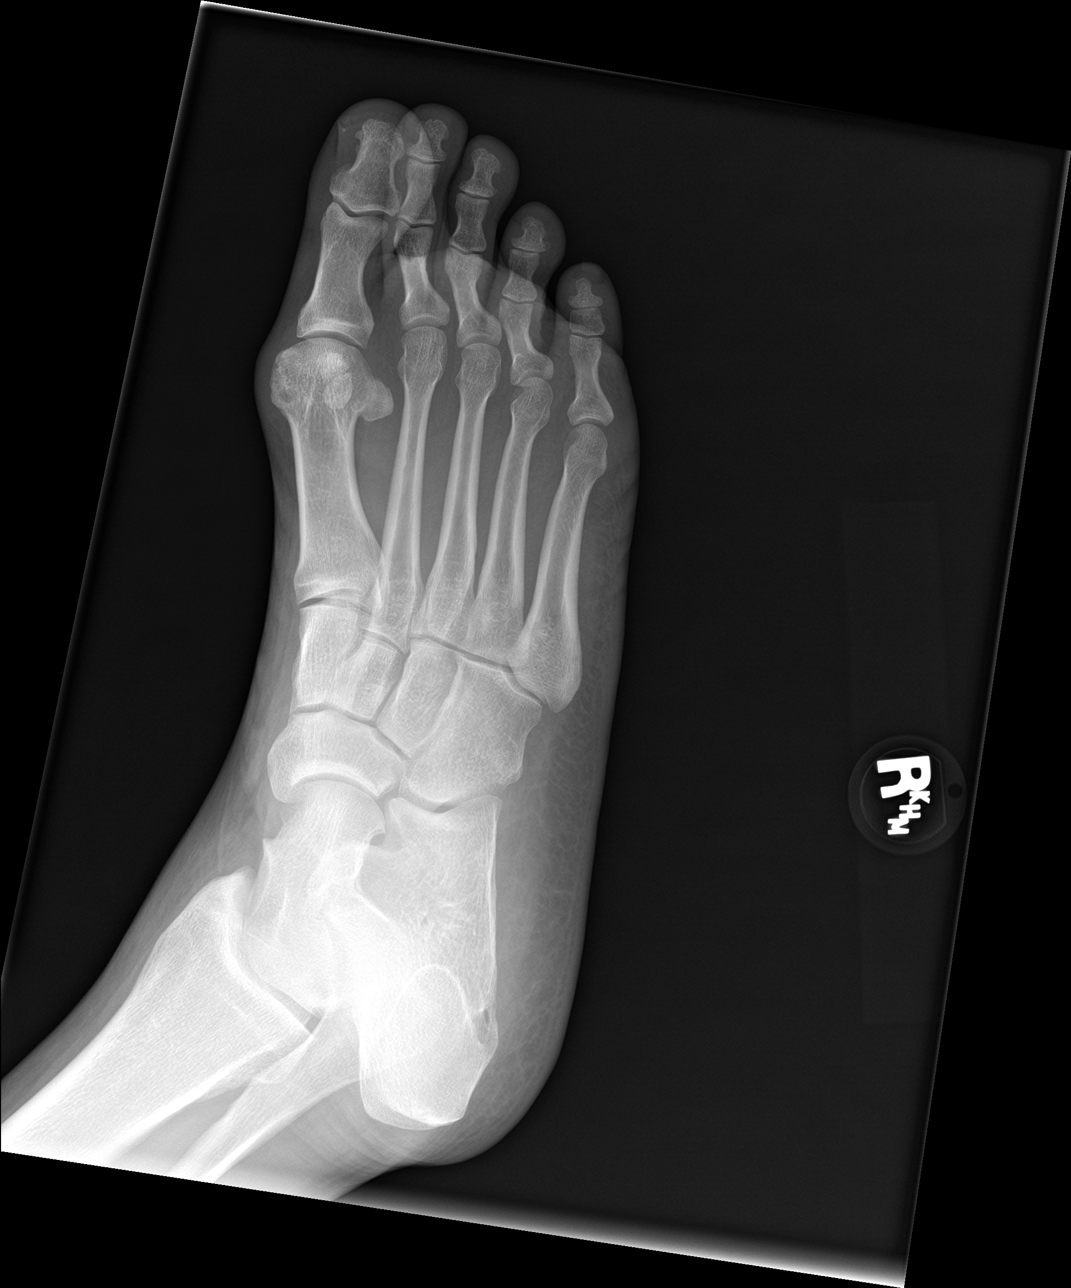

[foot lat]
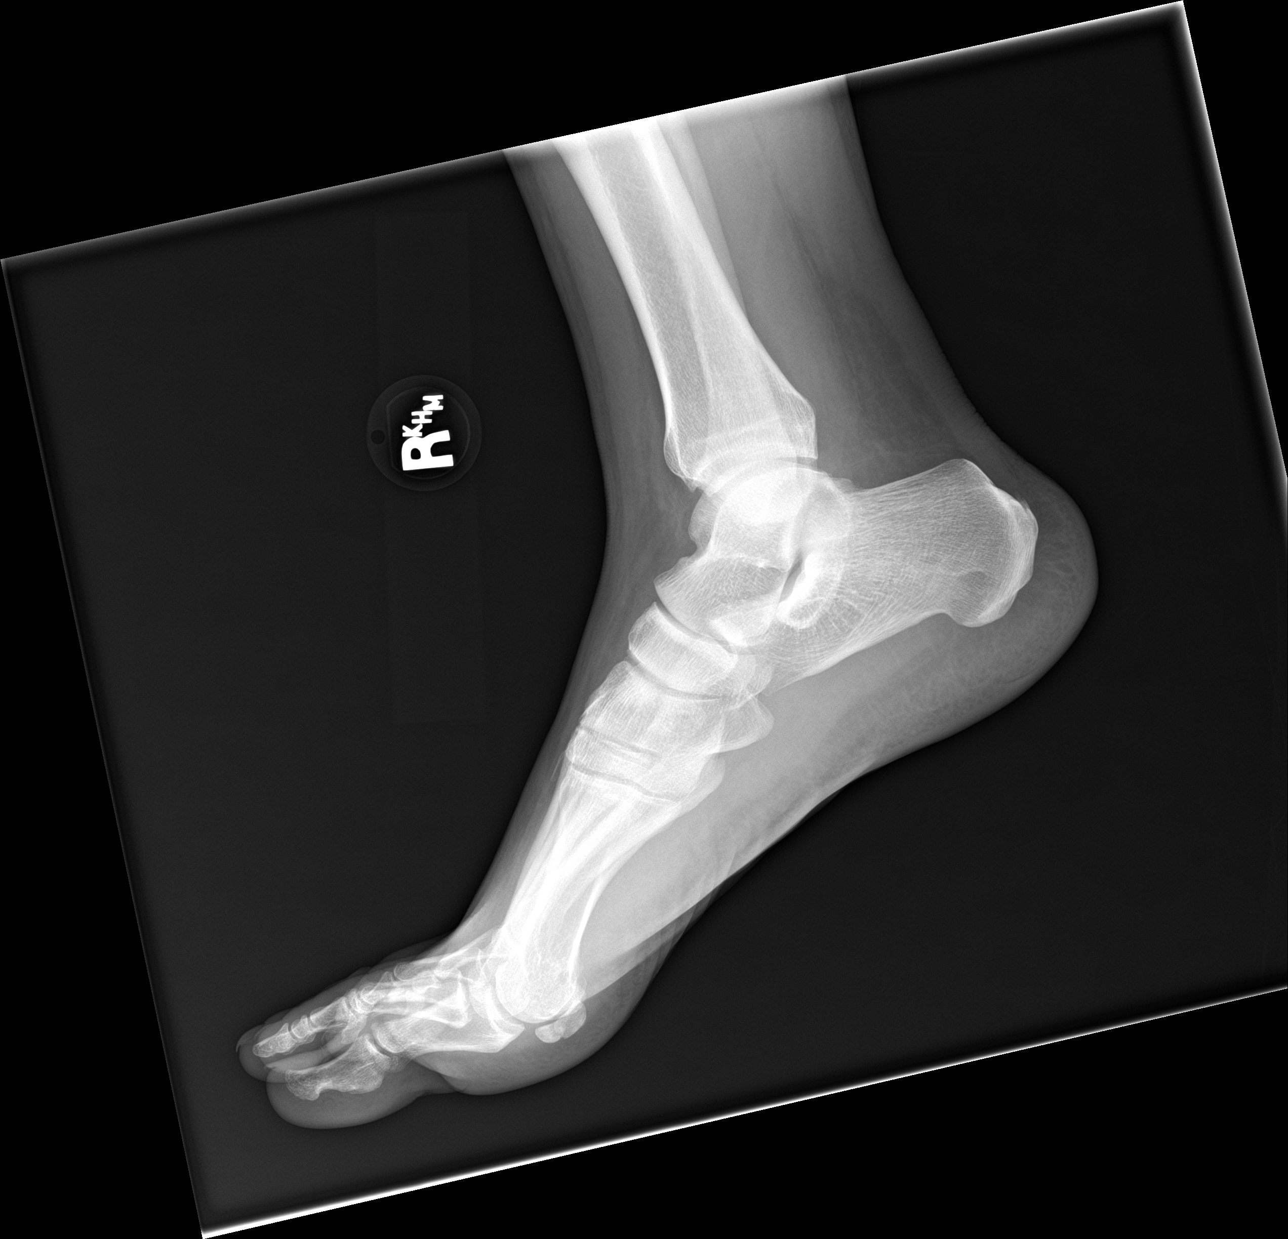

[3 of 3 positions shown; findings below may reference images not displayed]

FINDINGS: The mineralization and alignment are normal. There is no evidence of
acute fracture or dislocation. There are degenerative changes at the
first metatarsal phalangeal joint. The tibial sesamoid is bipartite.
No soft tissue emphysema or foreign body visualized.
IMPRESSION: No acute osseous findings.

## 2016-12-06 IMAGING — DX DG ANKLE COMPLETE 3+V*L*
3 series · 3 of 3 positions shown · non-contrast
Comparison: None.

CLINICAL DATA: Multiple soft tissue lacerations

EXAM:
LEFT ANKLE COMPLETE - 3+ VIEW

[ankle ap]
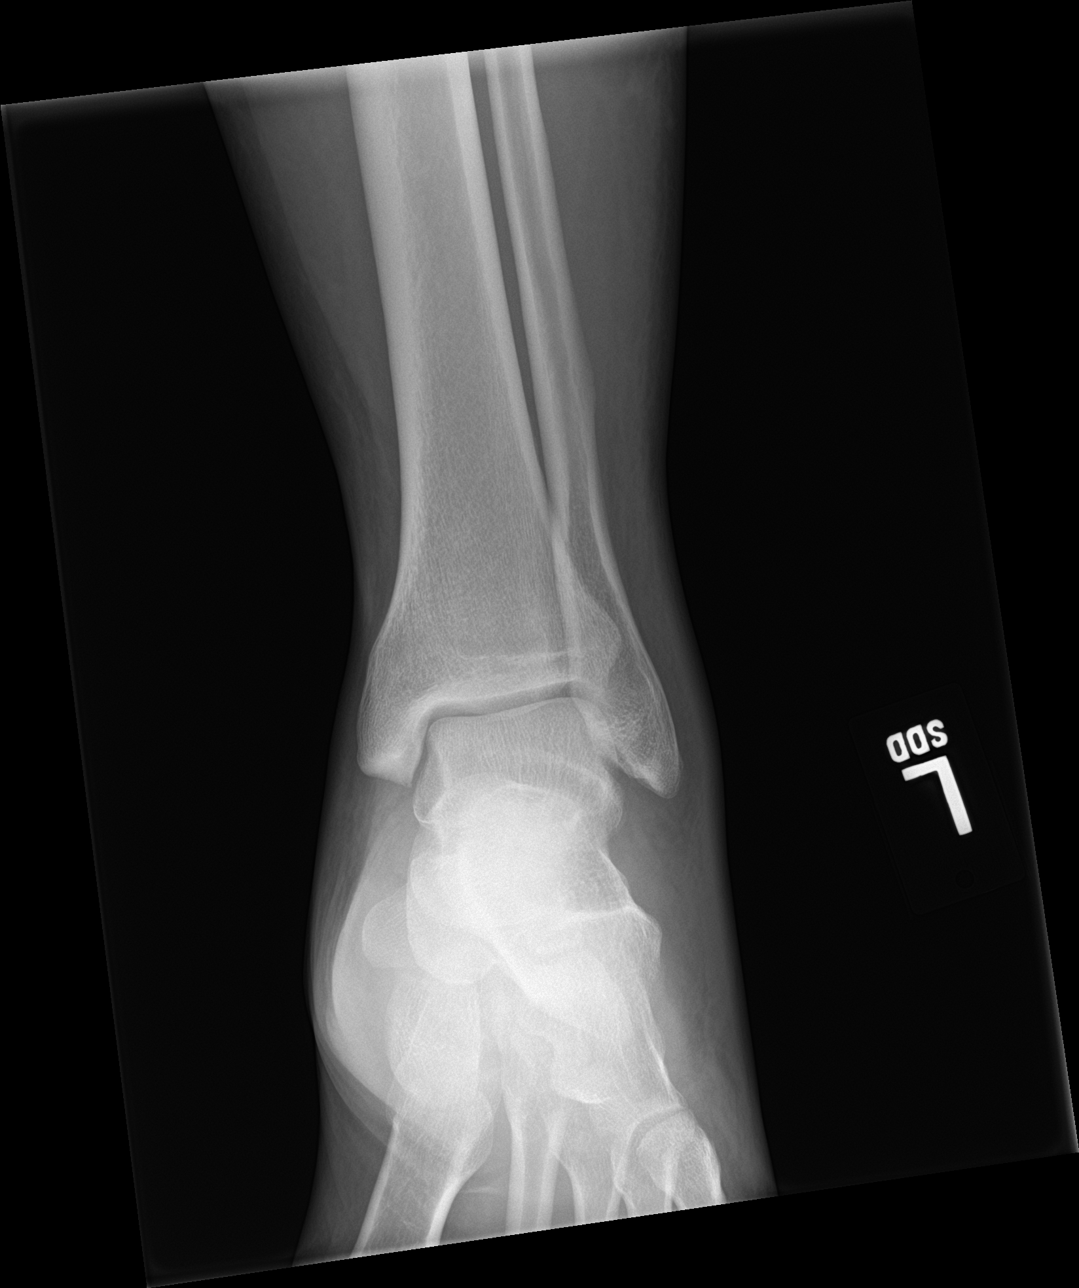

[ankle obl]
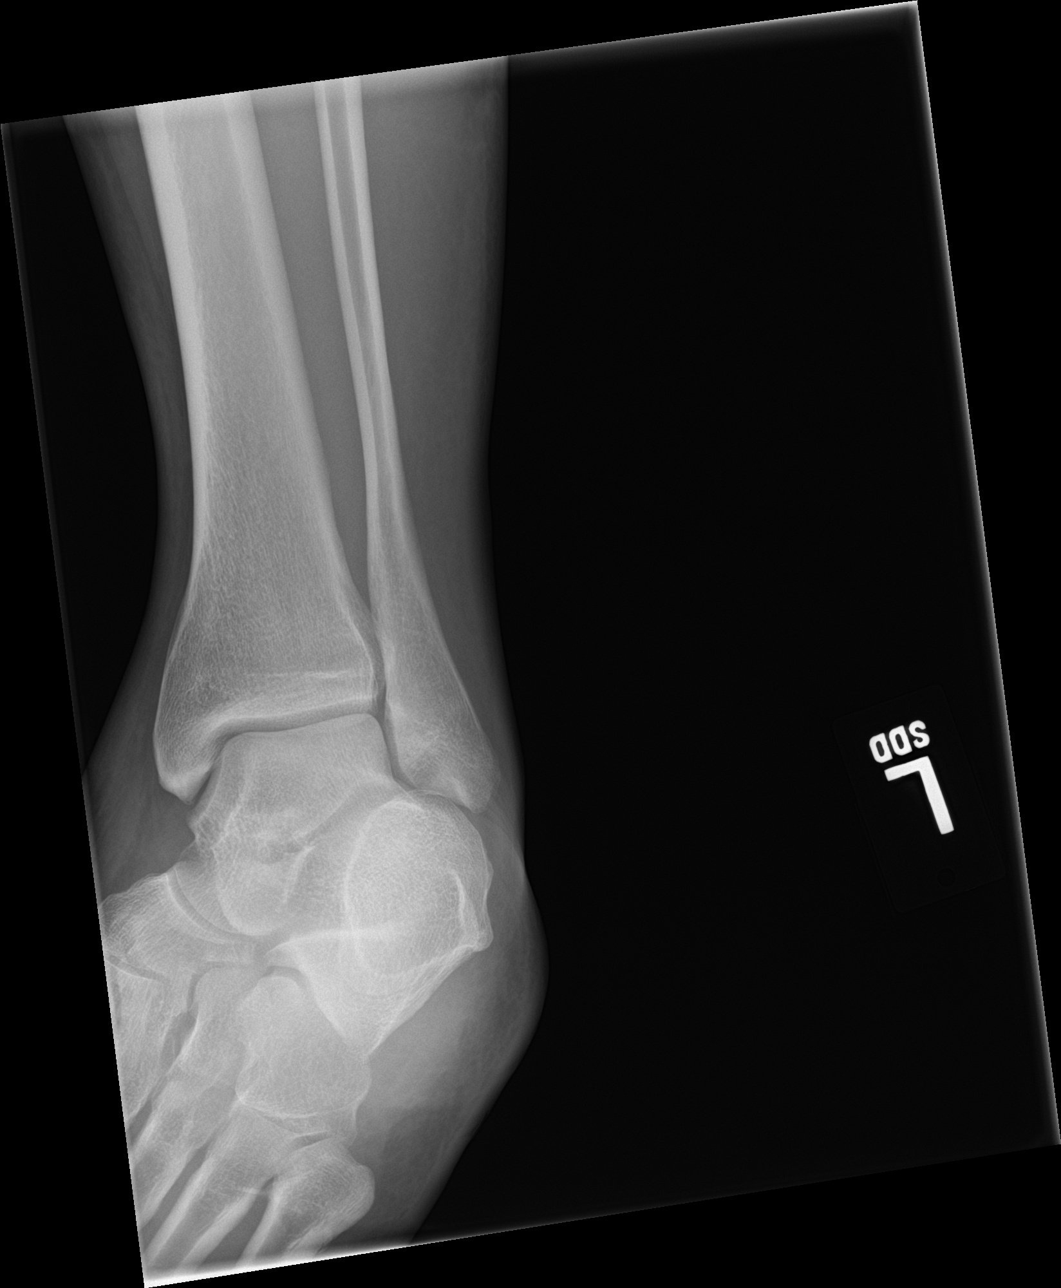

[ankle lat]
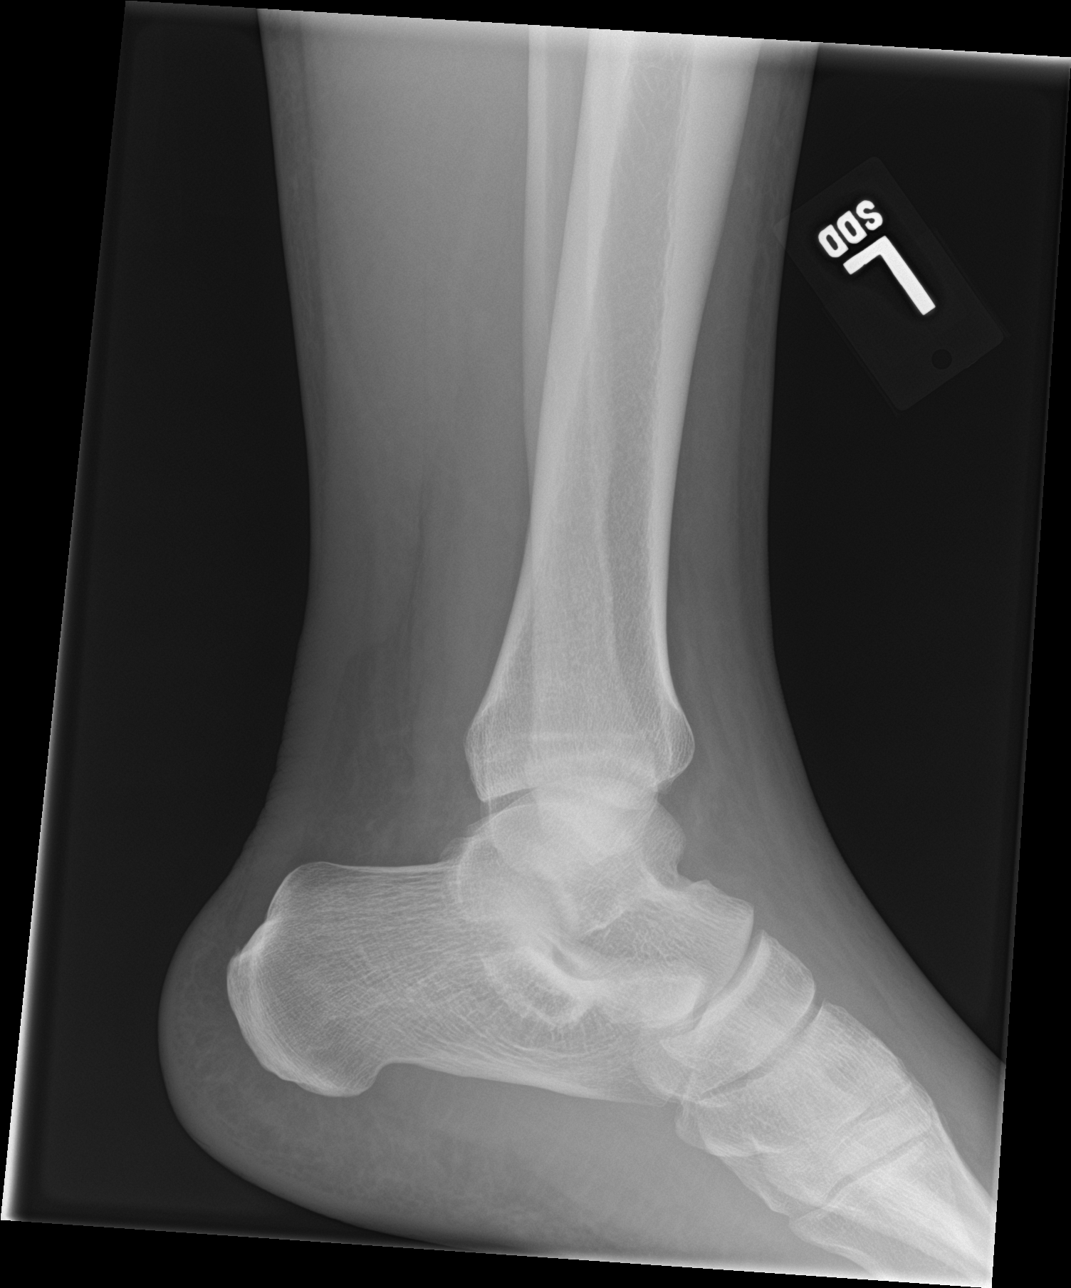

[3 of 3 positions shown; findings below may reference images not displayed]

FINDINGS: There is no evidence of fracture, dislocation, or joint effusion.
There is no evidence of arthropathy or other focal bone abnormality.
Soft tissues are unremarkable.
IMPRESSION: No acute abnormality noted.
